# Patient Record
Sex: Female | Born: 1970 | Race: Asian | Hispanic: Yes | Marital: Married | Smoking: Never smoker
Health system: Southern US, Community
[De-identification: ages and names within clinical notes are randomized; demographics above are authoritative.]

## PROBLEM LIST (undated history)

## (undated) NOTE — *Deleted (*Deleted)
It was great to see you!  Our plans for today:  - *** -   We are checking some labs today, I will call you if they are abnormal will send you a MyChart message or a letter if they are normal.  If you do not hear about your labs in the next 2 weeks please let us know.***  Take care and seek immediate care sooner if you develop any concerns.   Dr. Daysean Tinkham [facility]  

---

## 2019-09-17 ENCOUNTER — Other Ambulatory Visit: Payer: Self-pay

## 2019-09-17 ENCOUNTER — Ambulatory Visit (INDEPENDENT_AMBULATORY_CARE_PROVIDER_SITE_OTHER): Payer: Self-pay | Admitting: Family Medicine

## 2019-09-17 ENCOUNTER — Encounter: Payer: Self-pay | Admitting: Family Medicine

## 2019-09-17 VITALS — BP 120/62 | HR 75 | Ht 61.06 in | Wt 139.2 lb

## 2019-09-17 DIAGNOSIS — H04129 Dry eye syndrome of unspecified lacrimal gland: Secondary | ICD-10-CM

## 2019-09-17 DIAGNOSIS — H04123 Dry eye syndrome of bilateral lacrimal glands: Secondary | ICD-10-CM

## 2019-09-17 DIAGNOSIS — Z0000 Encounter for general adult medical examination without abnormal findings: Secondary | ICD-10-CM

## 2019-09-17 MED ORDER — SYSTANE 0.4-0.3 % OP SOLN: 1.0000 [drp] | OPHTHALMIC | 0 refills | Status: AC | PRN

## 2019-09-17 NOTE — Patient Instructions (Addendum)
It was great to see you!  Our plans for today:  -  We're going to check a few labs - I am prescribing a moisturizing eye drop  We are checking some labs today, we will call you or send you a letter if they are abnormal.   Take care and seek immediate care sooner if you develop any concerns.   Dr. [facility]

## 2019-09-17 NOTE — Progress Notes (Signed)
   Subjective:    Patient ID: Veronica Leon, female    DOB: March 26, 1971, 48 y.o.   MRN: [id number]   CC: Establish care  HPI:  Veronica Leon is a very pleasant 48 year old female that presents today to establish care.  She states that she has no health conditions and takes no medications at this time.  Does have family history with her father passing away in his 33s from a myocardial infarction.  Patient states for the past 5-6 years she has had an issue with some dry eyes.  She states that this affects both eyes equally and she has not found any particular time of the year where this was worse.  She denies any eye pain or any change in vision during this time.  She states that she did recently see an eye doctor about 4 to 5 months ago to check her vision and this appointment was unremarkable.  She states that the dry seem to bother her most at the end of the day, particularly when she has an air conditioner or heater running.  She has tried some moisturizing eyedrops in the past and these worked well for her.  ROS: pertinent noted in the HPI   Pertinent PMH, PSH, FH, SoHx: Father passed away in his 23s from a MI.  Smoking status - Never  Objective:  BP 120/62   Pulse 75   Ht 5' 1.06" (1.551 m)   Wt 139 lb 4 oz (63.2 kg)   SpO2 99%   BMI 26.26 kg/m   Vitals and nursing note reviewed  General: NAD, pleasant, able to participate in exam HEENT: Eyes without erythema, injection Cardiac: RRR, S1 S2 present. normal heart sounds, no murmurs. Respiratory: CTAB, normal effort, No wheezes, rales or rhonchi Abdomen: Bowel sounds present, no abdominal tenderness, nondistended, no hepatosplenomegaly Skin: warm and dry, no rashes noted Neuro: alert, no obvious focal deficits Psych: Normal affect and mood   Assessment & Plan:    Dry eyes Plan: -We will prescribe moisturizing eyedrops -Patient to follow-up if she has worsening symptoms, any eye pain, change in vision at which point we will  refer to an eye specialist. -Patient in agreement with plan  Healthcare maintenance: -We will check lipid panel today -We will check a CMP today  Lurline Del, [facility] PGY-1

## 2019-09-17 NOTE — Assessment & Plan Note (Signed)
Plan: -We will prescribe moisturizing eyedrops -Patient to follow-up if she has worsening symptoms, any eye pain, change in vision at which point we will refer to an eye specialist. -Patient in agreement with plan

## 2019-09-18 ENCOUNTER — Telehealth: Payer: Self-pay | Admitting: Family Medicine

## 2019-09-18 LAB — LIPID PANEL
Chol/HDL Ratio: 3.2 ratio (ref 0.0–4.4)
Cholesterol, Total: 186 mg/dL (ref 100–199)
HDL: 59 mg/dL (ref >39)
LDL Chol Calc (NIH): 114 mg/dL — ABNORMAL HIGH (ref 0–99)
Triglycerides: 68 mg/dL (ref 0–149)
VLDL Cholesterol Cal: 13 mg/dL (ref 5–40)

## 2019-09-18 LAB — COMPREHENSIVE METABOLIC PANEL
ALT: 15 IU/L (ref 0–32)
AST: 21 IU/L (ref 0–40)
Albumin/Globulin Ratio: 1.5 (ref 1.2–2.2)
Albumin: 4.4 g/dL (ref 3.8–4.8)
Alkaline Phosphatase: 83 IU/L (ref 39–117)
BUN/Creatinine Ratio: 8 — ABNORMAL LOW (ref 9–23)
BUN: 7 mg/dL (ref 6–24)
Bilirubin Total: 0.3 mg/dL (ref 0.0–1.2)
CO2: 25 mmol/L (ref 20–29)
Calcium: 9.8 mg/dL (ref 8.7–10.2)
Chloride: 102 mmol/L (ref 96–106)
Creatinine, Ser: 0.91 mg/dL (ref 0.57–1.00)
GFR calc Af Amer: 86 mL/min/{1.73_m2} (ref >59)
GFR calc non Af Amer: 75 mL/min/{1.73_m2} (ref >59)
Globulin, Total: 3 g/dL (ref 1.5–4.5)
Glucose: 85 mg/dL (ref 65–99)
Potassium: 4.8 mmol/L (ref 3.5–5.2)
Sodium: 139 mmol/L (ref 134–144)
Total Protein: 7.4 g/dL (ref 6.0–8.5)

## 2019-09-18 NOTE — Telephone Encounter (Signed)
Discussed lab results with patient, including her elevated LDL. Patient requests to put extra emphasis on her diet and exercise for 3-6 months and revisit the prospects of initiating a statin medication after that. She understands that her family history puts her at greater risk for cardiac events and that statins can reduce her LDL and overall risks of these events. Plan to follow up with patient in 3-6 months.

## 2020-06-11 ENCOUNTER — Other Ambulatory Visit: Payer: Self-pay | Admitting: Critical Care Medicine

## 2020-06-11 ENCOUNTER — Other Ambulatory Visit: Payer: Self-pay

## 2020-06-11 DIAGNOSIS — Z20822 Contact with and (suspected) exposure to covid-19: Secondary | ICD-10-CM

## 2020-06-12 LAB — NOVEL CORONAVIRUS, NAA: SARS-CoV-2, NAA: NOT DETECTED

## 2020-06-12 LAB — SPECIMEN STATUS REPORT

## 2020-06-12 LAB — SARS-COV-2, NAA 2 DAY TAT

## 2020-07-22 ENCOUNTER — Other Ambulatory Visit: Payer: Self-pay

## 2020-07-22 DIAGNOSIS — Z20822 Contact with and (suspected) exposure to covid-19: Secondary | ICD-10-CM

## 2020-07-24 LAB — SARS-COV-2, NAA 2 DAY TAT

## 2020-07-24 LAB — NOVEL CORONAVIRUS, NAA: SARS-CoV-2, NAA: NOT DETECTED

## 2020-07-26 NOTE — Progress Notes (Deleted)
Subjective:     Veronica Leon is a 49 y.o. female and is here for a comprehensive physical exam. The patient reports {problems:16946}.  Social History   Socioeconomic History  . Marital status: Married    Spouse name: Not on file  . Number of children: Not on file  . Years of education: Not on file  . Highest education level: Not on file  Occupational History  . Not on file  Tobacco Use  . Smoking status: Never Smoker  Substance and Sexual Activity  . Alcohol use: Yes    Comment: Rare occasion  . Drug use: Never  . Sexual activity: Not on file  Other Topics Concern  . Not on file  Social History Narrative  . Not on file   Social Determinants of Health   Financial Resource Strain:   . Difficulty of Paying Living Expenses: Not on file  Food Insecurity:   . Worried About Programme researcher, broadcasting/film/video in the Last Year: Not on file  . Ran Out of Food in the Last Year: Not on file  Transportation Needs:   . Lack of Transportation (Medical): Not on file  . Lack of Transportation (Non-Medical): Not on file  Physical Activity:   . Days of Exercise per Week: Not on file  . Minutes of Exercise per Session: Not on file  Stress:   . Feeling of Stress : Not on file  Social Connections:   . Frequency of Communication with Friends and Family: Not on file  . Frequency of Social Gatherings with Friends and Family: Not on file  . Attends Religious Services: Not on file  . Active Member of Clubs or Organizations: Not on file  . Attends Banker Meetings: Not on file  . Marital Status: Not on file  Intimate Partner Violence:   . Fear of Current or Ex-Partner: Not on file  . Emotionally Abused: Not on file  . Physically Abused: Not on file  . Sexually Abused: Not on file   Health Maintenance  Topic Date Due  . Hepatitis C Screening  Never done  . PAP SMEAR-Modifier  03/16/2020  . INFLUENZA VACCINE  05/16/2020  . HIV Screening  09/16/2020 (Originally 05/26/1986)  . TETANUS/TDAP   10/16/2024  . COVID-19 Vaccine  Completed    The following portions of the patient's history were reviewed and updated as appropriate: allergies, current medications, past family history, past medical history, past social history, past surgical history and problem list.  Review of Systems Pertinent items are noted in HPI.   Objective:    {female exam, choose systems:17926}    Assessment:    Healthy female exam. ***     Plan:     See After Visit Summary for Counseling Recommendations

## 2020-07-27 ENCOUNTER — Ambulatory Visit: Payer: Self-pay | Admitting: Family Medicine

## 2020-09-21 NOTE — Patient Instructions (Signed)
It was great to see you! Thank you for allowing me to participate in your care!  Our plans for today:  -We checked a lipid panel today, I will call you with the results of this when it returns.  If your cholesterol remains elevated I strongly recommend starting statin therapy and we can discuss this further when your labs result. -We also performed Pap smear today -We are also checking a lipid panel and a CBG for screening for diabetes as well as to follow-up on your high cholesterol level -If you continue to have prolonged bleeding for an additional 4 days from today which will be a total of 14 days I would like for you to follow-up with me.  We are checking a CBC to look for signs of anemia -We are also checking a TSH due to concern for hair loss and your past abnormal TSH when you were in [place].   We are checking some labs today, I will call you if they are abnormal will send you a MyChart message or a letter if they are normal.  If you do not hear about your labs in the next 2 weeks please let us know.  Take care and seek immediate care sooner if you develop any concerns.   Dr. Jackelyn Poling, DO [facility]

## 2020-09-21 NOTE — Progress Notes (Signed)
SUBJECTIVE:   CHIEF COMPLAINT / HPI:   Annual physical: Patient is a pleasant 49 year old female that presents for annual physical today.  She states that she walks for exercise 3-4 times per week.  Denies use of tobacco products.  States that she does occasionally consume alcohol.  Patient denies any use of illicit drugs.  She does state that she feels safe in her relationship.  Patient states she is a vegetarian.  Family history of cardiac disease: Patient has a significant family history for Father passed away in his 30s from myocardial infarction. Previously patient's cholesterol panel showed an LDL elevated at 114.  It was discussed with the patient about starting statin therapy particularly due to her family history, however patient wished to defer that and focus on diet and exercise at that time.    Concern for hypothyroidism: Patient endorses some hair loss over past few months and when she was in [place] she had a thyroid check which was borderline abnormal.  Patient states that her energy level has been appropriate but she would like to have her TSH checked as she had the borderline abnormal 1 when it was checked in [place].  Abnormal uterine bleeding: Patient states that she normally has menstrual cycles which last 4 to 5 days of moderate bleeding followed by 2 days of light bleeding.  However her last menstrual cycle started a little bit late and has been going on for approximately 10 days.  She states this is been light to moderate and she does not have any overall concerns as this is only been one menstrual cycle.  She denies any decreased energy and does not believe to have any other symptoms of anemia.  She endorses that she feels this is most likely as she is depression menopause.  PERTINENT  PMH / PSH: Family history of cardiac disease  OBJECTIVE:   BP 108/64   Pulse 70   Ht 5\' 1"  (1.549 m)   Wt 144 lb (65.3 kg)   LMP 09/13/2020 (Exact Date)   BMI 27.21 kg/m     General: NAD, pleasant, able to participate in exam HEENT: No pharyngeal erythema Cardiac: RRR, no murmurs. Respiratory: CTAB, normal effort, No wheezes, rales or rhonchi Abdomen: Bowel sounds present, nontender Extremities: no edema or cyanosis. Pelvic exam: VULVA: normal appearing vulva with no masses, tenderness or lesions, VAGINA: normal appearing vagina with normal color and discharge, no lesions, CERVIX: cervical discharge present - bloody, exam chaperoned by Jazmin. Neuro: alert, no obvious focal deficits Psych: Normal affect and mood  ASSESSMENT/PLAN:   Hair loss Assessment: 49 year old female with recent hair loss over the past few months and a previous TSH in 54 which she states was borderline abnormal.  Patient endorses having good energy levels but would like her TSH rechecked today for verification. Plan: -We will to recheck TSH -Follow-up as needed  Abnormal uterine bleeding (AUB) Assessment: 49 year old female with prolonged uterine bleeding as she is approaching menopause.  She states that her periods usually last for around 4 days with 2 following days which are lighter, however this last cycle has been lasting for approximately 10 days but has been light to moderate throughout that time.  Patient does not have any significant current concerns for anemia but I would like to check a CBC for this.  Patient overall has little concerned about this as she is only having light bleeding at this time Plan: -We will check CBC as above -Patient plans to let me know  if her menstrual cycle does not end in the next 3 to 4 days  Family history of heart disease Assessment: 49 year old female with a previous LDL level of 114, and a significant family history with her father passed away in his 30s from myocardial infarction.  Previously discussed with the patient about starting statin therapy however she wished to focus on diet and exercise at that time.  We will recheck her lipid  panel today, I have discussed with the patient my recommendation to start statin therapy if she is above goal on her LDL. Plan: -Lipid panel as above -If LDL remains elevated will discuss with patient about the recommendation of starting statin therapy, she endorses today that she really prefers to avoid medication if possible but she understands that I may discuss starting statin therapy if it is indicated.  She understands that she ultimately has a final decision in this.  Health maintenance: -Pap smear performed today -Lipid panel as above -Patient request a CBG to screen for diabetes, she states she is not interested in A1c at this time.  Jackelyn Poling, DO [facility]    This note was prepared using Dragon voice recognition software and may include unintentional dictation errors due to the inherent limitations of voice recognition software.

## 2020-09-22 ENCOUNTER — Ambulatory Visit (INDEPENDENT_AMBULATORY_CARE_PROVIDER_SITE_OTHER): Payer: 59 | Admitting: Family Medicine

## 2020-09-22 ENCOUNTER — Encounter: Payer: Self-pay | Admitting: Family Medicine

## 2020-09-22 ENCOUNTER — Other Ambulatory Visit: Payer: Self-pay

## 2020-09-22 ENCOUNTER — Other Ambulatory Visit (HOSPITAL_COMMUNITY)
Admission: RE | Admit: 2020-09-22 | Discharge: 2020-09-22 | Disposition: A | Discharge status: Home / Self Care | Payer: 59 | Source: Ambulatory Visit | Attending: Family Medicine | Admitting: Family Medicine

## 2020-09-22 VITALS — BP 108/64 | HR 70 | Ht 61.0 in | Wt 144.0 lb

## 2020-09-22 DIAGNOSIS — Z124 Encounter for screening for malignant neoplasm of cervix: Secondary | ICD-10-CM

## 2020-09-22 DIAGNOSIS — N939 Abnormal uterine and vaginal bleeding, unspecified: Secondary | ICD-10-CM | POA: Diagnosis not present

## 2020-09-22 DIAGNOSIS — Z131 Encounter for screening for diabetes mellitus: Secondary | ICD-10-CM

## 2020-09-22 DIAGNOSIS — Z8249 Family history of ischemic heart disease and other diseases of the circulatory system: Secondary | ICD-10-CM

## 2020-09-22 DIAGNOSIS — Z13220 Encounter for screening for lipoid disorders: Secondary | ICD-10-CM | POA: Diagnosis not present

## 2020-09-22 DIAGNOSIS — L659 Nonscarring hair loss, unspecified: Secondary | ICD-10-CM | POA: Diagnosis not present

## 2020-09-22 LAB — GLUCOSE, POCT (MANUAL RESULT ENTRY): POC Glucose: 86 mg/dl (ref 70–99)

## 2020-09-22 NOTE — Assessment & Plan Note (Signed)
Assessment: 49 year old female with a previous LDL level of 114, and a significant family history with her father passed away in his 30s from myocardial infarction.  Previously discussed with the patient about starting statin therapy however she wished to focus on diet and exercise at that time.  We will recheck her lipid panel today, I have discussed with the patient my recommendation to start statin therapy if she is above goal on her LDL. Plan: -Lipid panel as above -If LDL remains elevated will discuss with patient about the recommendation of starting statin therapy, she endorses today that she really prefers to avoid medication if possible but she understands that I may discuss starting statin therapy if it is indicated.  She understands that she ultimately has a final decision in this.

## 2020-09-22 NOTE — Assessment & Plan Note (Signed)
Assessment: 49 year old female with recent hair loss over the past few months and a previous TSH in [place] which she states was borderline abnormal.  Patient endorses having good energy levels but would like her TSH rechecked today for verification. Plan: -We will to recheck TSH -Follow-up as needed

## 2020-09-22 NOTE — Assessment & Plan Note (Addendum)
Assessment: 49 year old female with prolonged uterine bleeding as she is approaching menopause.  She states that her periods usually last for around 4 days with 2 following days which are lighter, however this last cycle has been lasting for approximately 10 days but has been light to moderate throughout that time.  Patient does not have any significant current concerns for anemia but I would like to check a CBC for this.  Patient overall has little concerned about this as she is only having light bleeding at this time Plan: -We will check CBC as above -Patient plans to let me know if her menstrual cycle does not end in the next 3 to 4 days

## 2020-09-23 ENCOUNTER — Other Ambulatory Visit: Payer: Self-pay | Admitting: Family Medicine

## 2020-09-23 ENCOUNTER — Telehealth: Payer: Self-pay | Admitting: Family Medicine

## 2020-09-23 DIAGNOSIS — R7989 Other specified abnormal findings of blood chemistry: Secondary | ICD-10-CM

## 2020-09-23 DIAGNOSIS — L659 Nonscarring hair loss, unspecified: Secondary | ICD-10-CM

## 2020-09-23 LAB — CBC WITH DIFFERENTIAL/PLATELET
Basophils Absolute: 0 10*3/uL (ref 0.0–0.2)
Basos: 1 %
EOS (ABSOLUTE): 0.2 10*3/uL (ref 0.0–0.4)
Eos: 3 %
Hematocrit: 43.6 % (ref 34.0–46.6)
Hemoglobin: 14.5 g/dL (ref 11.1–15.9)
Immature Grans (Abs): 0 10*3/uL (ref 0.0–0.1)
Immature Granulocytes: 0 %
Lymphocytes Absolute: 2.1 10*3/uL (ref 0.7–3.1)
Lymphs: 40 %
MCH: 29 pg (ref 26.6–33.0)
MCHC: 33.3 g/dL (ref 31.5–35.7)
MCV: 87 fL (ref 79–97)
Monocytes Absolute: 0.4 10*3/uL (ref 0.1–0.9)
Monocytes: 9 %
Neutrophils Absolute: 2.4 10*3/uL (ref 1.4–7.0)
Neutrophils: 47 %
Platelets: 319 10*3/uL (ref 150–450)
RBC: 5 x10E6/uL (ref 3.77–5.28)
RDW: 12.2 % (ref 11.7–15.4)
WBC: 5.1 10*3/uL (ref 3.4–10.8)

## 2020-09-23 LAB — LIPID PANEL
Chol/HDL Ratio: 3 ratio (ref 0.0–4.4)
Cholesterol, Total: 200 mg/dL — ABNORMAL HIGH (ref 100–199)
HDL: 67 mg/dL (ref >39)
LDL Chol Calc (NIH): 119 mg/dL — ABNORMAL HIGH (ref 0–99)
Triglycerides: 77 mg/dL (ref 0–149)
VLDL Cholesterol Cal: 14 mg/dL (ref 5–40)

## 2020-09-23 LAB — TSH: TSH: 7.74 u[IU]/mL — ABNORMAL HIGH (ref 0.450–4.500)

## 2020-09-23 NOTE — Telephone Encounter (Signed)
Called patient to discuss her lab results.  Patient had an LDL which was above goal as well as family history with her father passed away in his 30s from myocardial infarction.  I discussed with patient on the phone as well as several times prior to this that I do strongly recommend a statin medication due to her family history.  Patient endorses that she is about to start a more vigorous exercise routine and has already adjusted her diet and would request rechecking an LDL in 6 months.  With regard to her TSH the patient does not endorse any symptoms of hypothyroidism other than some possible hair loss.  She does not endorse any loss of energy or unusual weight gain.  I discussed with patient that there are 3 options for this elevated TSH.  Option 1, we could recheck it in about [redacted] weeks along with a free T4 and make adjustments depending on this.  Option 2, we could start a low-dose of Synthroid 25 mcg/day because that she believes she had an elevated TSH in [place] in the recent past.  An option 3 would be to do nothing and continue to monitor for any symptoms as her TSH likely represents subclinical hyperthyroidism at this time.  Patient dorsals that she would like to recheck this in about 6 weeks as well as free T4.  I explained patient that I would place an order for a future lab draw for TSH and free T4 for about 6 weeks now she plans to make an appointment at her convenience to come in for the lab draw.  I also explained to the patient that her CBC was within normal limits.

## 2020-09-24 LAB — CYTOLOGY - PAP
Comment: NEGATIVE
Diagnosis: NEGATIVE
High risk HPV: NEGATIVE

## 2020-11-03 ENCOUNTER — Other Ambulatory Visit: Payer: 59

## 2020-11-03 ENCOUNTER — Other Ambulatory Visit: Payer: Self-pay

## 2020-11-03 DIAGNOSIS — R7989 Other specified abnormal findings of blood chemistry: Secondary | ICD-10-CM

## 2020-11-03 DIAGNOSIS — L659 Nonscarring hair loss, unspecified: Secondary | ICD-10-CM

## 2020-11-04 ENCOUNTER — Encounter: Payer: Self-pay | Admitting: Family Medicine

## 2020-11-04 LAB — TSH+FREE T4
Free T4: 0.97 ng/dL (ref 0.82–1.77)
TSH: 8.5 u[IU]/mL — ABNORMAL HIGH (ref 0.450–4.500)

## 2021-03-03 ENCOUNTER — Encounter: Payer: Self-pay | Admitting: Family Medicine

## 2021-03-03 DIAGNOSIS — E785 Hyperlipidemia, unspecified: Secondary | ICD-10-CM

## 2021-03-03 DIAGNOSIS — R7989 Other specified abnormal findings of blood chemistry: Secondary | ICD-10-CM

## 2021-03-29 ENCOUNTER — Other Ambulatory Visit: Payer: 59

## 2021-03-29 ENCOUNTER — Other Ambulatory Visit: Payer: Self-pay

## 2021-03-29 DIAGNOSIS — R7989 Other specified abnormal findings of blood chemistry: Secondary | ICD-10-CM

## 2021-03-29 DIAGNOSIS — E785 Hyperlipidemia, unspecified: Secondary | ICD-10-CM

## 2021-03-30 LAB — TSH: TSH: 4.67 u[IU]/mL — ABNORMAL HIGH (ref 0.450–4.500)

## 2021-03-30 LAB — LDL CHOLESTEROL, DIRECT: LDL Direct: 90 mg/dL (ref 0–99)

## 2021-04-04 NOTE — Progress Notes (Signed)
    SUBJECTIVE:   CHIEF COMPLAINT / HPI:   Hyperlipidemia: Patient is 50 year old female who presents today to follow-up on hyperlipidemia.  Patient is previously been hesitant to start a statin even though her father did pass away from an MI in his 8s. Last lipid panel showed an LDL of 119, HDL of 67, this was in December 2021.  Our plan had been for her to work on diet and exercise and follow-up approximately 6 months later for recheck of direct LDL and additional consideration of starting a statin.  She states she is walking most days of the week for 40 minutes of brisk walking. She is on the wait list for swimming lessons and plans to start swimming once she learns.  Subclinical hypothyroidism: Patient with several TSH checks over the past year.  7.74, 8.50, 4.67.  She also had checked a free T4 during the 8.50 check which was within normal limits.  We have had previous discussions about subclinical hypothyroidism and the pros and cons of initiating treatment.  She states she has not been experiencing fatigue at this time. .  Chipping of toe nails. Nothing in finger nails, great toe on both feet have some flaking and chipping of nails over last month and a half. No burning in feet, no systemic symptoms.  PERTINENT  PMH / PSH: History of hyperlipidemia  OBJECTIVE:   BP 121/64   Pulse 62   Ht 5\' 1"  (1.549 m)   Wt 143 lb 9.6 oz (65.1 kg)   LMP 03/25/2021   SpO2 99%   BMI 27.13 kg/m    General: NAD, pleasant, able to participate in exam Cardiac: RRR, no murmurs. Respiratory: CTAB, normal effort, No wheezes, rales or rhonchi Skin: warm and dry, great toe nail with some flaking and chipping bilaterally. Neuro: alert, no obvious focal deficits Psych: Normal affect and mood  ASSESSMENT/PLAN:   Subclinical hypothyroidism Will check TSH in 6 months.  Family history of heart disease Last LDL of improved from 119 to 90.  Patient continues to do 40 minutes of brisk walking most days  of the week.  We discussed the pros and cons of statin usage.  I do still think it would be appropriate with her family history of her father passed away in his 30s from an MI.  That being said her numbers have substantially improved with her diet and exercise and through shared decision making we decided to recheck it in about 6 months to see how it continues to improve.   Health maintenance Recommended screening colonoscopy for colon cancer screening.She wants to hold off.  Discussed Cologuard and we can continue having discussion with this at next appointment.  Toenail flaking: Likely fungal.  No pitting.  Some flaking and chipping of thick toenails most pronounced on the great toe bilaterally.  No chipping or pitting of the fingernails.  Likely fungal in etiology.  Discussed treatment modalities including long-term antifungals.  This did pop up over the past month or 2 and the patient has increased her exercise.  Patient wishes to hold off at this time and we can consider treating in the future.  She understands that treatment can be prolonged.  05/25/2021, DO [facility]

## 2021-04-05 ENCOUNTER — Encounter: Payer: Self-pay | Admitting: Family Medicine

## 2021-04-05 ENCOUNTER — Ambulatory Visit (INDEPENDENT_AMBULATORY_CARE_PROVIDER_SITE_OTHER): Payer: 59 | Admitting: Family Medicine

## 2021-04-05 ENCOUNTER — Other Ambulatory Visit: Payer: Self-pay

## 2021-04-05 VITALS — BP 121/64 | HR 62 | Ht 61.0 in | Wt 143.6 lb

## 2021-04-05 DIAGNOSIS — E038 Other specified hypothyroidism: Secondary | ICD-10-CM | POA: Diagnosis not present

## 2021-04-05 DIAGNOSIS — Z8249 Family history of ischemic heart disease and other diseases of the circulatory system: Secondary | ICD-10-CM

## 2021-04-05 DIAGNOSIS — E785 Hyperlipidemia, unspecified: Secondary | ICD-10-CM

## 2021-04-05 NOTE — Assessment & Plan Note (Signed)
Last LDL of improved from 119 to 90.  Patient continues to do 40 minutes of brisk walking most days of the week.  We discussed the pros and cons of statin usage.  I do still think it would be appropriate with her family history of her father passed away in his 30s from an MI.  That being said her numbers have substantially improved with her diet and exercise and through shared decision making we decided to recheck it in about 6 months to see how it continues to improve.

## 2021-04-05 NOTE — Patient Instructions (Signed)
Congratulations on the great work with your LDL!  I would like to see back in 6 to 12 months and we can recheck your cholesterol and your thyroid level  If your toenails become a big problem let me know and we can always start an antifungal medication.  I think it is fine to monitor them for the time being  I do recommend considering a colonoscopy or the Cologuard to screen for colon cancer.  We can continue to have discussions about this.

## 2021-04-05 NOTE — Assessment & Plan Note (Signed)
Will check TSH in 6 months.

## 2021-08-15 ENCOUNTER — Encounter: Payer: Self-pay | Admitting: Family Medicine

## 2021-08-16 ENCOUNTER — Other Ambulatory Visit: Payer: Self-pay | Admitting: Family Medicine

## 2021-08-16 DIAGNOSIS — Z8249 Family history of ischemic heart disease and other diseases of the circulatory system: Secondary | ICD-10-CM

## 2021-08-16 DIAGNOSIS — E038 Other specified hypothyroidism: Secondary | ICD-10-CM

## 2021-09-07 ENCOUNTER — Other Ambulatory Visit: Payer: 59

## 2021-09-07 ENCOUNTER — Other Ambulatory Visit: Payer: Self-pay

## 2021-09-07 DIAGNOSIS — Z8249 Family history of ischemic heart disease and other diseases of the circulatory system: Secondary | ICD-10-CM

## 2021-09-07 DIAGNOSIS — E038 Other specified hypothyroidism: Secondary | ICD-10-CM

## 2021-09-08 LAB — TSH: TSH: 6.23 u[IU]/mL — ABNORMAL HIGH (ref 0.450–4.500)

## 2021-09-08 LAB — LDL CHOLESTEROL, DIRECT: LDL Direct: 114 mg/dL — ABNORMAL HIGH (ref 0–99)

## 2021-09-14 ENCOUNTER — Ambulatory Visit: Payer: 59 | Admitting: Family Medicine

## 2021-09-14 ENCOUNTER — Other Ambulatory Visit: Payer: Self-pay

## 2021-09-14 ENCOUNTER — Ambulatory Visit (INDEPENDENT_AMBULATORY_CARE_PROVIDER_SITE_OTHER): Payer: 59 | Admitting: Family Medicine

## 2021-09-14 ENCOUNTER — Encounter: Payer: Self-pay | Admitting: Family Medicine

## 2021-09-14 ENCOUNTER — Ambulatory Visit (INDEPENDENT_AMBULATORY_CARE_PROVIDER_SITE_OTHER): Payer: 59

## 2021-09-14 DIAGNOSIS — E785 Hyperlipidemia, unspecified: Secondary | ICD-10-CM

## 2021-09-14 DIAGNOSIS — E038 Other specified hypothyroidism: Secondary | ICD-10-CM | POA: Diagnosis not present

## 2021-09-14 DIAGNOSIS — Z23 Encounter for immunization: Secondary | ICD-10-CM | POA: Diagnosis not present

## 2021-09-14 NOTE — Patient Instructions (Signed)
Today we discussed your previous lab checks including your TSH and LDL.  We do not need to do anything for the TSH at this time unless you have significant symptoms.  For the LDL I do recommend starting a medication called atorvastatin.  As we discussed once you get back from your trip you can let me know and I will send it into the pharmacy.  I recommend that we recheck an LDL about 3 to 4 months after starting that medication.

## 2021-09-14 NOTE — Assessment & Plan Note (Signed)
LDL elevated at 114.  She has a severe family history with a father having an MI at age 50 and an uncle on the father side with bypass surgery by age 76 for heart attack.  She has previously been hesitant about starting statin medication due to wanting to avoid medications.  Diet and exercise have unfortunately not gotten the LDL down to our goal.  I would shoot for a goal of less than 70 due to her family history.  She is interested in starting atorvastatin when she returns from her trip to [place], later in December.  She is going to let me know when she gets back and wants me to send in the prescription at that time.  We will start atorvastatin 40 mg at that time.  Follow-up in 3 months after that for LDL direct.

## 2021-09-14 NOTE — Assessment & Plan Note (Signed)
TSH consistent with subclinical hypothyroidism.  Patient has not experiencing any significant symptoms such as hair loss, severe fatigue or change in fatigue, or constipation.  Follow-up as needed.  Discussed that there is no need for medication at this time

## 2021-09-14 NOTE — Progress Notes (Signed)
    SUBJECTIVE:   CHIEF COMPLAINT / HPI:   Lab Follow up: Has had reduced motivation with exercise lately. She is leaving for about 3 weeks to travel to [place]. She denies significant change in fatigue, hairloss, constipation.  LDL: 114 on last check. Father with MI at age 50. Uncle with bypass surgery at age 35.  We previously discussed starting a statin but she has been hesitant due to not liking to be on medications unless absolutely necessary.  She denies any symptoms at this time.  PERTINENT  PMH / PSH: History of subclinical hypothyroidism  OBJECTIVE:   BP 109/64   Pulse 65   Ht 5\' 1"  (1.549 m)   Wt 145 lb 8 oz (66 kg)   LMP 08/23/2021   SpO2 99%   BMI 27.49 kg/m    General: NAD, pleasant, able to participate in exam Cardiac: RRR, no murmurs. Respiratory: CTAB, normal effort Psych: Normal affect and mood  ASSESSMENT/PLAN:   Subclinical hypothyroidism TSH consistent with subclinical hypothyroidism.  Patient has not experiencing any significant symptoms such as hair loss, severe fatigue or change in fatigue, or constipation.  Follow-up as needed.  Discussed that there is no need for medication at this time  Hyperlipidemia LDL elevated at 114.  She has a severe family history with a father having an MI at age 27 and an uncle on the father side with bypass surgery by age 39 for heart attack.  She has previously been hesitant about starting statin medication due to wanting to avoid medications.  Diet and exercise have unfortunately not gotten the LDL down to our goal.  I would shoot for a goal of less than 70 due to her family history.  She is interested in starting atorvastatin when she returns from her trip to [place], later in December.  She is going to let me know when she gets back and wants me to send in the prescription at that time.  We will start atorvastatin 40 mg at that time.  Follow-up in 3 months after that for LDL direct.     January, DO [facility]

## 2022-02-13 ENCOUNTER — Encounter: Payer: Self-pay | Admitting: Family Medicine

## 2022-02-13 ENCOUNTER — Ambulatory Visit (INDEPENDENT_AMBULATORY_CARE_PROVIDER_SITE_OTHER): Payer: Managed Care, Other (non HMO) | Admitting: Family Medicine

## 2022-02-13 VITALS — BP 121/70 | HR 55 | Wt 145.0 lb

## 2022-02-13 DIAGNOSIS — R5383 Other fatigue: Secondary | ICD-10-CM | POA: Diagnosis not present

## 2022-02-13 DIAGNOSIS — E785 Hyperlipidemia, unspecified: Secondary | ICD-10-CM

## 2022-02-13 DIAGNOSIS — Z114 Encounter for screening for human immunodeficiency virus [HIV]: Secondary | ICD-10-CM

## 2022-02-13 DIAGNOSIS — E038 Other specified hypothyroidism: Secondary | ICD-10-CM

## 2022-02-13 DIAGNOSIS — Z1231 Encounter for screening mammogram for malignant neoplasm of breast: Secondary | ICD-10-CM

## 2022-02-13 DIAGNOSIS — Z1159 Encounter for screening for other viral diseases: Secondary | ICD-10-CM

## 2022-02-13 NOTE — Patient Instructions (Signed)
Today we are checking some labs including your LDL, thyroid labs, and a CBC for fatigue.  I will let you know the results when they return. ? ?I have sent a referral for endocrinology due to the elevated thyroid peroxidase level.  They should contact you in the next 1 to 2 weeks. ? ?For generalized health maintenance it also recommends colonoscopy, mammogram, HIV, and hepatitis C.  I have ordered the mammogram, HIV level, and hepatitis C level.  Once we have these completed I will let you know the results.  I do recommend considering the colonoscopy which I can always place in the future.  You do not need an appointment for this, just simply call and let me know. ?

## 2022-02-13 NOTE — Progress Notes (Signed)
? ? ?SUBJECTIVE:  ? ?CHIEF COMPLAINT / HPI:  ? ?Hyperlipidemia: ?Previous LDL 114.  Patient with significant family history of father having an MI at age 51 and her uncle and father side having bypass surgery by age 68 for heart attack.  At previous appointment she has been hesitant to start a statin medication but her last appointment she decided that she would like to initiate this when she returns from her trip to [place].  Today she presents to initiate atorvastatin.  She states she had her LDL checked in [place] in December and presents with lab work showing LDL of 95. ? ?Subclinical Hypothyroidism: ?Last TSH in [place] was in December and was 8 with normal T3 and T4. She also had thyroid peroxidase level checked and was significantly elevated at 60.4 (normal 0.08-8.0). She states over the last month she has noticed more fatigue, particularly in the morning. She normally is a "morning person" and wakes up early to exercise. ? ?Tachycardia: ?In [place] she felt her heart racing and fluttering and checked her BP at home which gives heart rate and noticed it was in the 140s, she took a photo of the bp cuff. This lasted for few minutes and then resolved. She denies any bouts of this since then. She denies chest pain when it occurred and denies presyncope or light headedness. ? ? ?PERTINENT  PMH / PSH: Subclinical hypothyroidism ? ?OBJECTIVE:  ? ?BP 121/70   Pulse (!) 55   Wt 145 lb (65.8 kg)   LMP 01/25/2022 (Exact Date)   SpO2 100%   BMI 27.40 kg/m?   ? ?General: NAD, pleasant, able to participate in exam ?HEENT: No thyromegaly or thyroid nodules ?Cardiac: Mildly bradycardic, no murmurs ?Respiratory: CTAB, normal effort, No wheezes, rales or rhonchi ?Skin: warm and dry, no rashes noted ?Neuro: alert, no obvious focal deficits ?Psych: Normal affect and mood ? ?ASSESSMENT/PLAN:  ? ? ?Hyperlipidemia: ?Significant family history with father having an MI at age 59 and her uncle having bypass surgery for heart attack  at age 21.  Previous LDL 114, however she had this checked in December in [place] with an LDL of 95.  She request rechecking it today.  Did discuss that it would be reasonable to consider statin regardless of the level due to her family history but also think it is reasonable to recheck and see where it is at and if it continues to improve then a statin would be less needed.   ? ?Tachycardia: ?Happened once about 5 months ago with heart rate in the 140s for several minutes.  She did not have any presyncope or chest pain when this occurred.  She continues to exercise and has not had this happen since then.  I did discuss that there was a possibility that she could have an arrhythmia that she does not feel but if she is not having symptoms and is only happened once 5 months ago I do not think we have to do further intervention at this time.  Discussed with her that if it happens again she should let us know but I think there is low benefit to a cardiology referral or Holter monitor given 1 bout of symptoms. ? ?Health maintenance: ?We will screen for breast cancer with mammogram, HIV, and hepatitis C as is recommended.  Discussed colonoscopy and she wishes to hold off on that at this time as she is vegetarian and has no family history of colon cancer.  I did recommend that despite this  it was still a recommendation and she is going to continue to consider it. ? ?Subclinical hypothyroidism  elevated thyroid peroxidase level: ?I have been following her with subclinical hypothyroidism for some time and recently when she was in [place] she had her levels rechecked as well as her thyroid peroxidase level.  This was significantly elevated at 60.  Discussed today that it would be reasonable to do a referral for endocrinology.  We also been to check her TSH and T4 for levels today.  She does endorse some increased fatigue which may be at play with this. ? ?Fatigue: ?See above in the subclinical hypothyroidism section.  We will  check a TSH and T4.  We will also check a CBC today.  She will follow-up once these labs are completed to decide extra test that may be needed. ? ?Jackelyn Poling, DO ?[facility]  ? ? ? ?

## 2022-02-14 ENCOUNTER — Other Ambulatory Visit: Payer: Self-pay | Admitting: Family Medicine

## 2022-02-14 DIAGNOSIS — D696 Thrombocytopenia, unspecified: Secondary | ICD-10-CM

## 2022-02-14 LAB — CBC
Hematocrit: 42.9 % (ref 34.0–46.6)
Hemoglobin: 14 g/dL (ref 11.1–15.9)
MCH: 28.3 pg (ref 26.6–33.0)
MCHC: 32.6 g/dL (ref 31.5–35.7)
MCV: 87 fL (ref 79–97)
Platelets: 112 10*3/uL — ABNORMAL LOW (ref 150–450)
RBC: 4.94 x10E6/uL (ref 3.77–5.28)
RDW: 12.9 % (ref 11.7–15.4)
WBC: 5.5 10*3/uL (ref 3.4–10.8)

## 2022-02-14 LAB — TSH+FREE T4
Free T4: 0.95 ng/dL (ref 0.82–1.77)
TSH: 5.74 u[IU]/mL — ABNORMAL HIGH (ref 0.450–4.500)

## 2022-02-14 LAB — HCV INTERPRETATION

## 2022-02-14 LAB — LDL CHOLESTEROL, DIRECT: LDL Direct: 104 mg/dL — ABNORMAL HIGH (ref 0–99)

## 2022-02-14 LAB — HCV AB W REFLEX TO QUANT PCR: HCV Ab: NONREACTIVE

## 2022-02-14 LAB — HIV ANTIBODY (ROUTINE TESTING W REFLEX): HIV Screen 4th Generation wRfx: NONREACTIVE

## 2022-02-14 NOTE — Progress Notes (Signed)
Called patient discussed her results.  TSH is mildly elevated similar to past levels.  We will continue with the referral to endocrinology given her elevated thyroid peroxidase antibody on her lab work performed in [place]. ? ?LDL cholesterol of 104.  Continue to recommend consideration for statin medication given her family history of early myocardial infarctions particular on her father's side.  We will continue to have discussions about this. ? ?HIV screen is negative as is the hepatitis C screen. ? ?CBC was mostly within normal limits with exception of a platelet count of 112.  Previous platelet count from 1 year ago was 319.  Discussed with her on the phone and she has not noticed any increase in bleeding or blood in stool or black stools.  She does not endorse occasional nosebleeds which quickly resolved.  Endorses no coughing up blood or ongoing nosebleeds.  I discussed that these are likely unrelated to the platelet count and are probably more due to seasonal allergies.  I did recommend rechecking the CBC in 1 month or sooner if she develops any significant bruising, bleeding, abdominal pain, or nosebleeds that do not quickly resolve or become more consistent.  We will order this future lab.

## 2022-03-01 ENCOUNTER — Ambulatory Visit
Admission: RE | Admit: 2022-03-01 | Discharge: 2022-03-01 | Disposition: A | Discharge status: Home / Self Care | Payer: Commercial Managed Care - HMO | Source: Ambulatory Visit | Attending: Family Medicine | Admitting: Family Medicine

## 2022-03-01 DIAGNOSIS — Z1231 Encounter for screening mammogram for malignant neoplasm of breast: Secondary | ICD-10-CM

## 2022-03-02 ENCOUNTER — Other Ambulatory Visit: Payer: Self-pay | Admitting: Family Medicine

## 2022-03-02 DIAGNOSIS — R928 Other abnormal and inconclusive findings on diagnostic imaging of breast: Secondary | ICD-10-CM

## 2022-03-16 ENCOUNTER — Ambulatory Visit
Admission: RE | Admit: 2022-03-16 | Discharge: 2022-03-16 | Disposition: A | Discharge status: Home / Self Care | Payer: Commercial Managed Care - HMO | Source: Ambulatory Visit | Attending: Family Medicine | Admitting: Family Medicine

## 2022-03-16 ENCOUNTER — Other Ambulatory Visit: Payer: Commercial Managed Care - HMO

## 2022-03-16 ENCOUNTER — Other Ambulatory Visit: Payer: Self-pay | Admitting: Family Medicine

## 2022-03-16 DIAGNOSIS — R921 Mammographic calcification found on diagnostic imaging of breast: Secondary | ICD-10-CM

## 2022-03-16 DIAGNOSIS — D696 Thrombocytopenia, unspecified: Secondary | ICD-10-CM

## 2022-03-16 DIAGNOSIS — R928 Other abnormal and inconclusive findings on diagnostic imaging of breast: Secondary | ICD-10-CM

## 2022-03-17 LAB — CBC
Hematocrit: 42.5 % (ref 34.0–46.6)
Hemoglobin: 14.2 g/dL (ref 11.1–15.9)
MCH: 29.3 pg (ref 26.6–33.0)
MCHC: 33.4 g/dL (ref 31.5–35.7)
MCV: 88 fL (ref 79–97)
Platelets: 287 10*3/uL (ref 150–450)
RBC: 4.84 x10E6/uL (ref 3.77–5.28)
RDW: 12.6 % (ref 11.7–15.4)
WBC: 6.4 10*3/uL (ref 3.4–10.8)

## 2022-03-21 ENCOUNTER — Encounter: Payer: Self-pay | Admitting: *Deleted

## 2022-03-23 ENCOUNTER — Ambulatory Visit
Admission: RE | Admit: 2022-03-23 | Discharge: 2022-03-23 | Disposition: A | Discharge status: Home / Self Care | Payer: Commercial Managed Care - HMO | Source: Ambulatory Visit | Attending: Family Medicine | Admitting: Family Medicine

## 2022-03-23 DIAGNOSIS — R921 Mammographic calcification found on diagnostic imaging of breast: Secondary | ICD-10-CM

## 2022-07-12 ENCOUNTER — Telehealth: Payer: Self-pay

## 2022-07-12 NOTE — Telephone Encounter (Signed)
Patient calls nurse line requesting an apt for her thyroid.   Patient reports she is unsure when she is supposed to return to [facility] for a FU.  Thyroid was last checked in May, however precious PCP sent a referral to endocrinology. Patient reports she did not know this.   [facility] office information given to patient to contact. Patient to call back if she has any issues scheduling an apt with them.   Patient appreciative.

## 2023-05-01 IMAGING — MG MM DIGITAL DIAGNOSTIC UNILAT*R*
3 series · 3 of 3 positions shown · non-contrast
Comparison: Previous exam(s).

CLINICAL DATA: Screening recall for right breast calcifications.

EXAM:
DIGITAL DIAGNOSTIC UNILATERAL RIGHT MAMMOGRAM
TECHNIQUE: Right digital diagnostic mammography was performed. Mammographic
images were processed with CAD.

[R ML (1 of 2)]
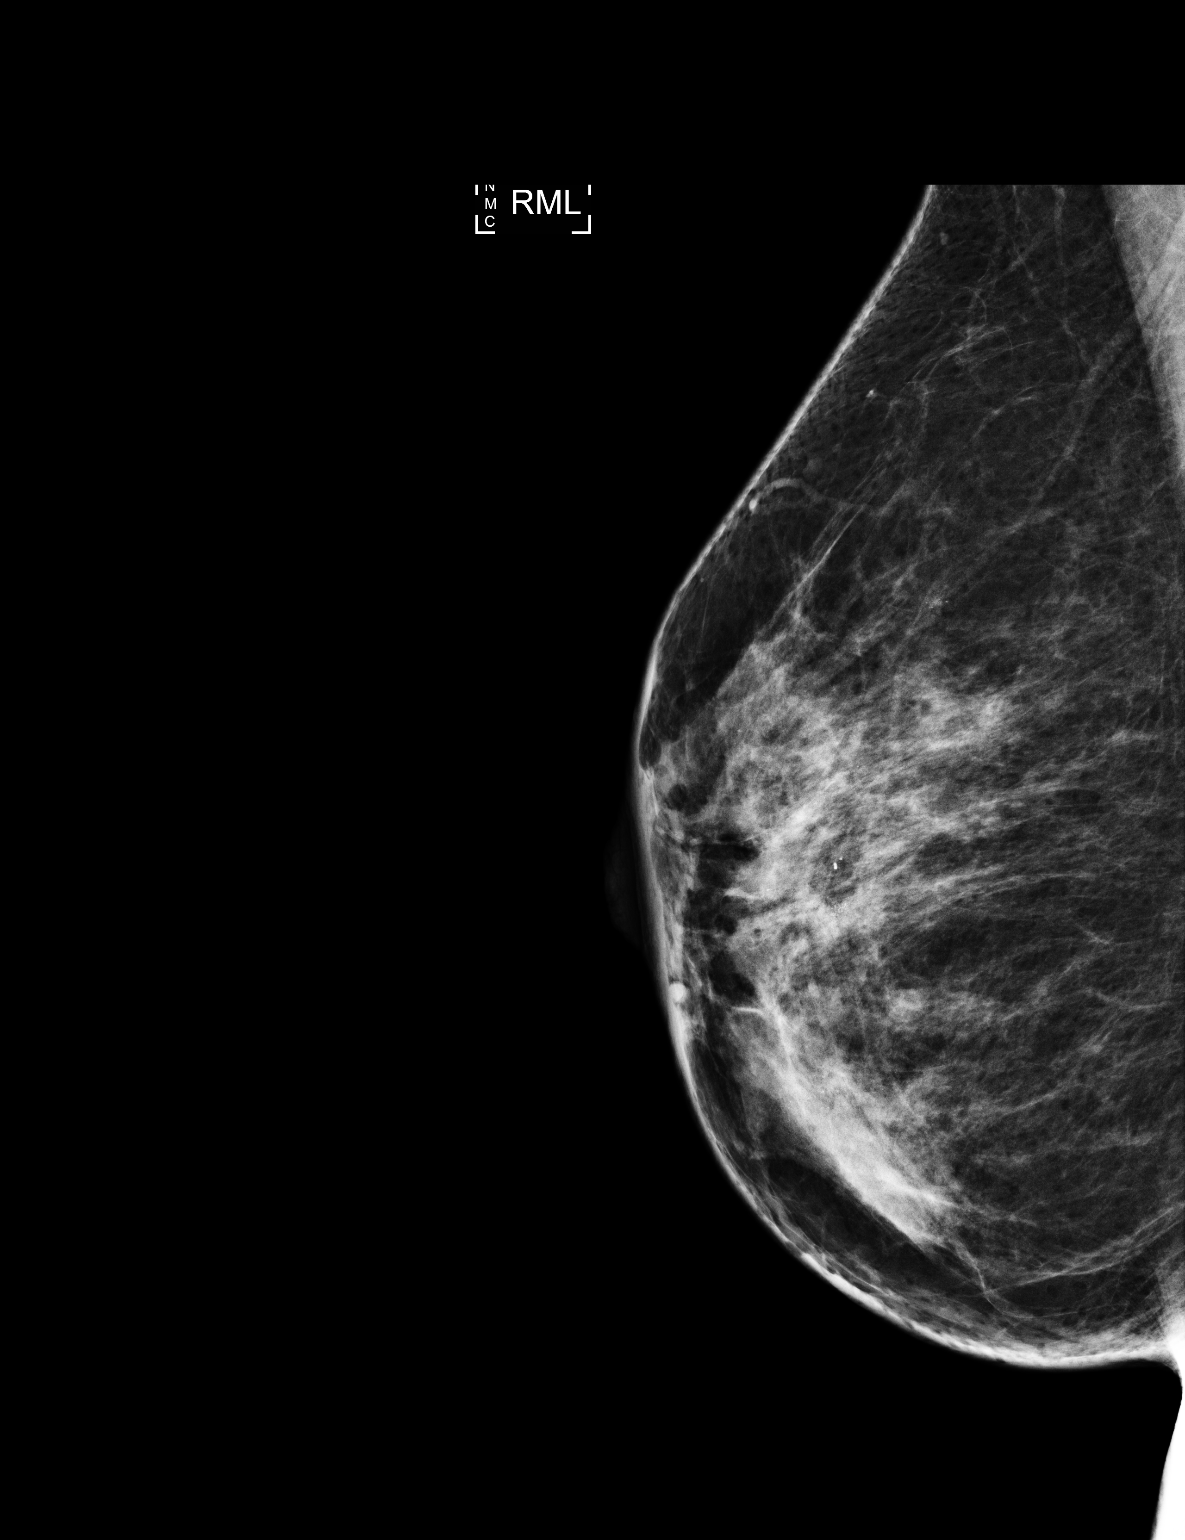

[R CC]
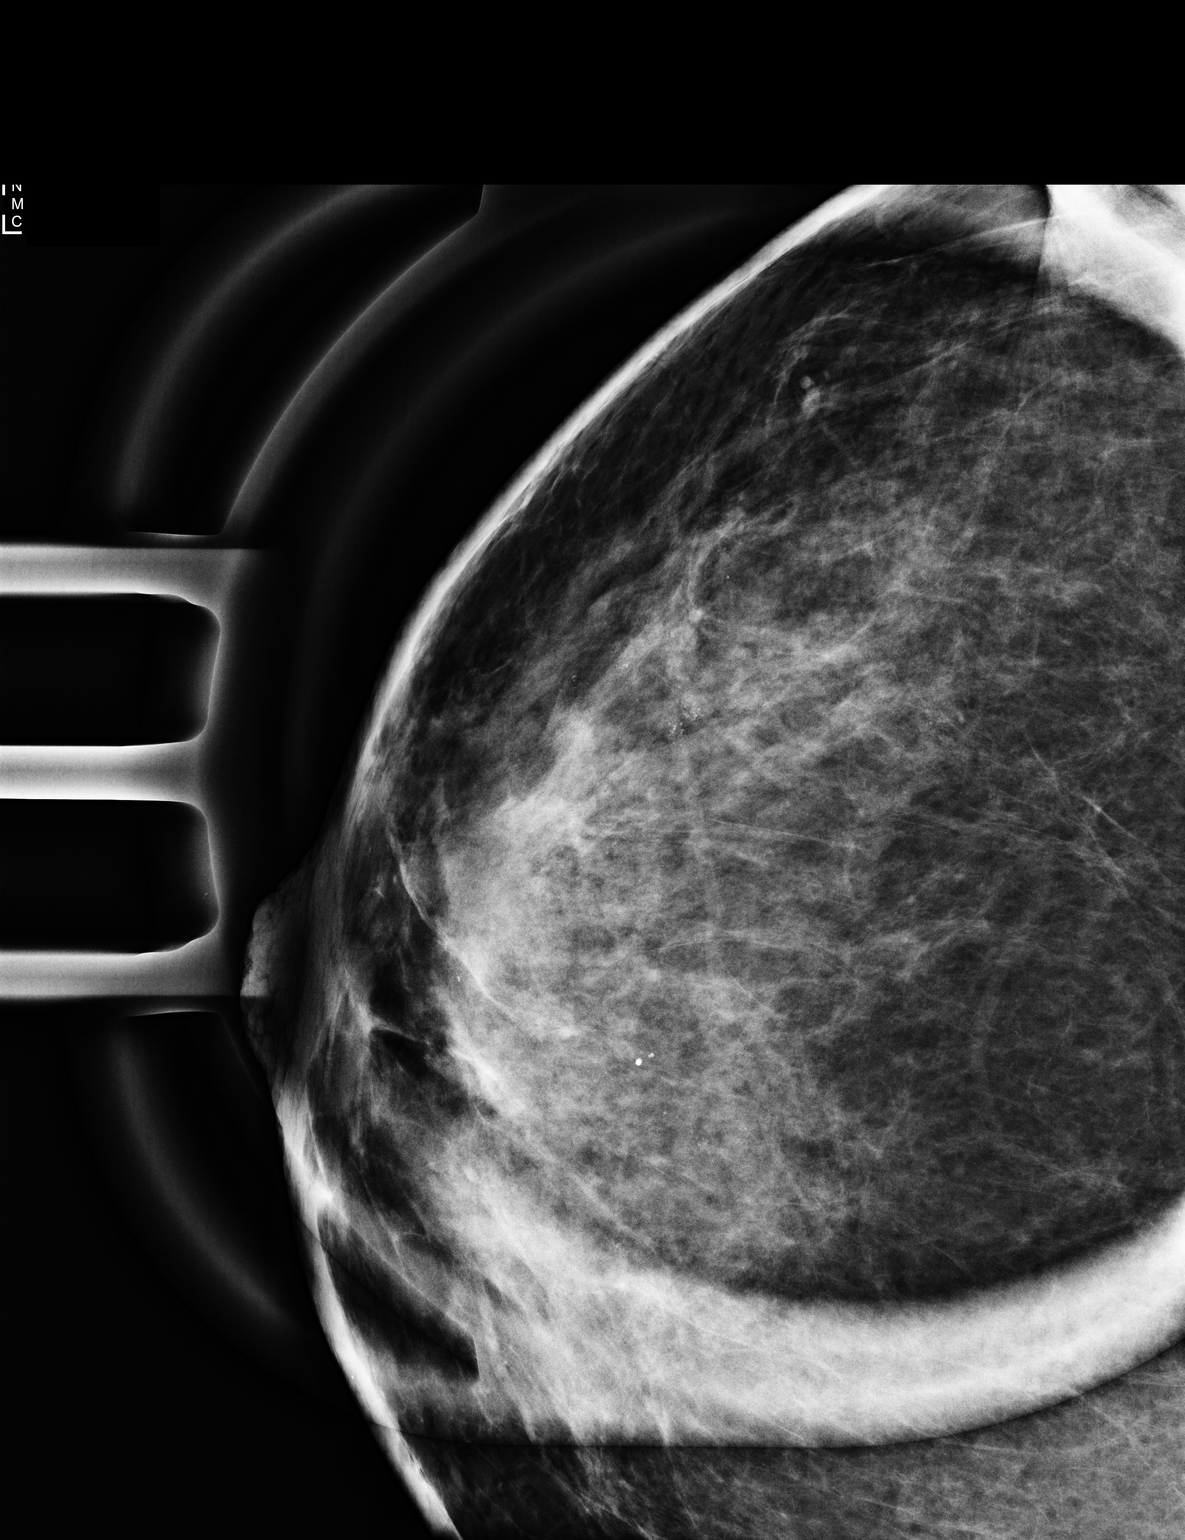

[R ML (2 of 2)]
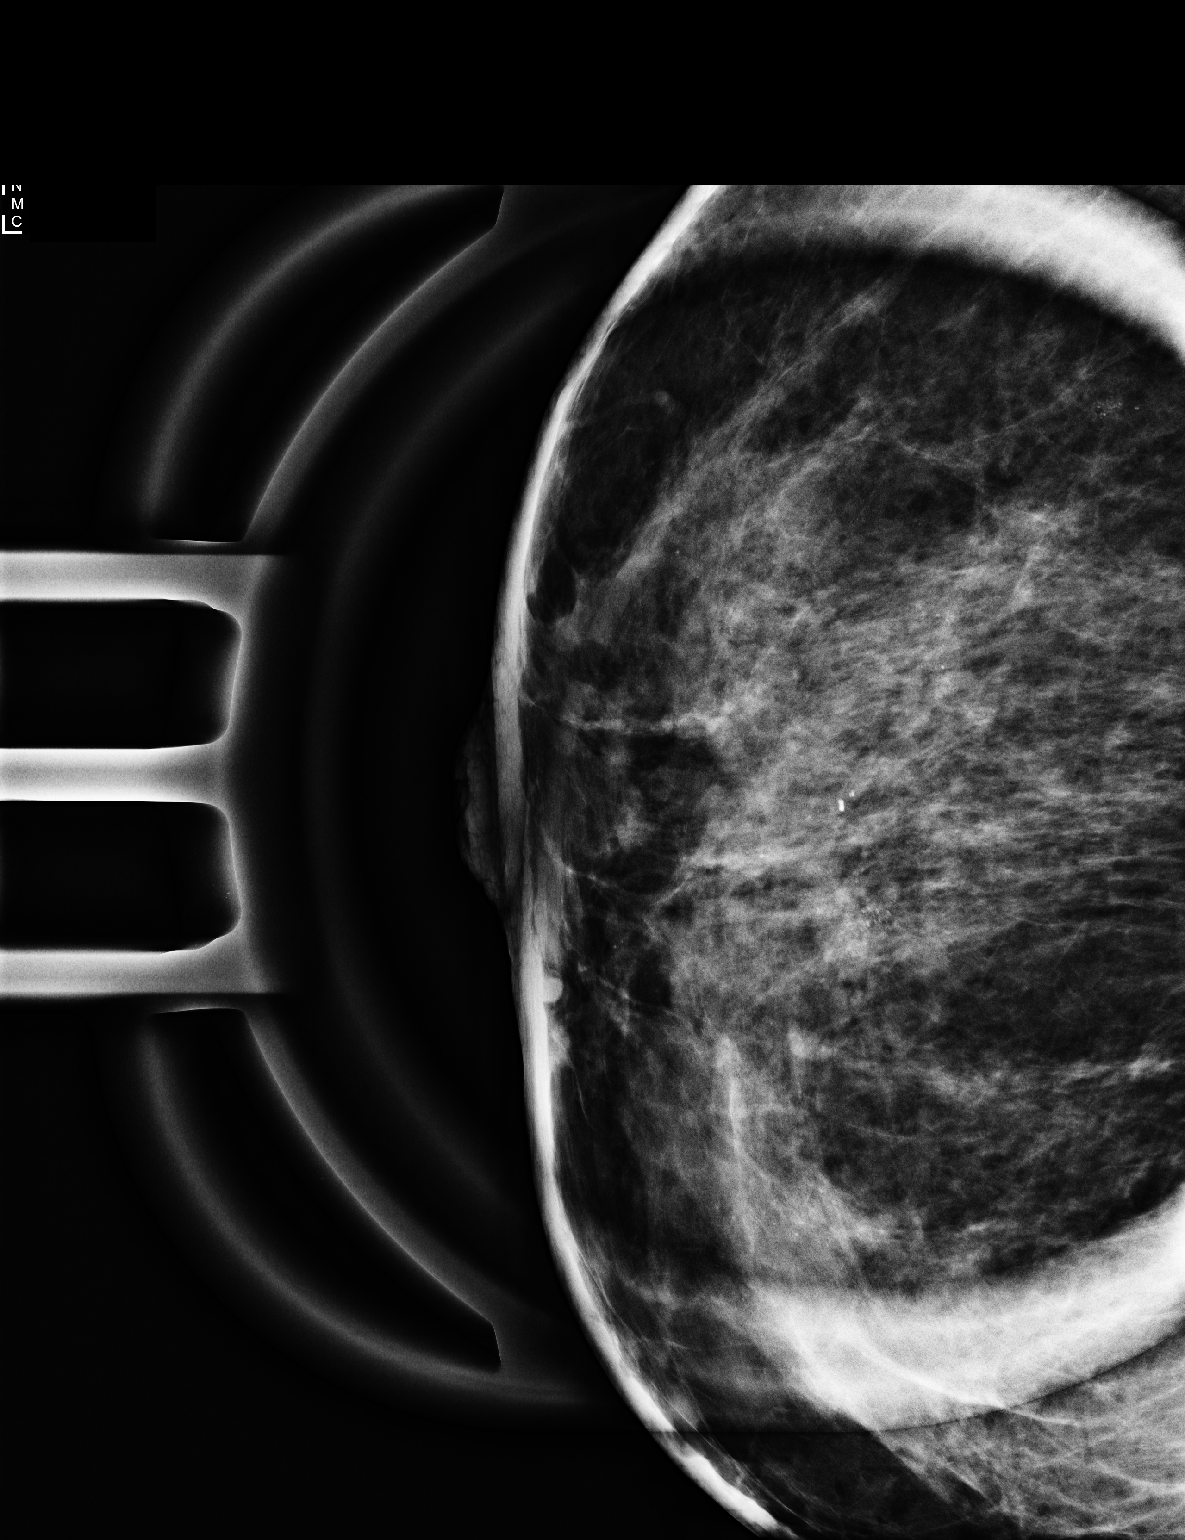

[3 of 3 positions shown; findings below may reference images not displayed]

ACR Breast Density Category c: The breast tissue is heterogeneously
dense, which may obscure small masses.
FINDINGS: Spot compression magnification views were performed over the central
to upper outer right breast demonstrating mixed punctate and
amorphous calcifications, several of which are scattered and loosely
grouped, however a more discrete group of these calcifications in
the lateral retroareolar right breast measures 1 cm and an
additional small more discrete group of these calcifications located
posterosuperior measures 0.4 cm.
IMPRESSION: Indeterminate calcifications in the central to upper outer right
breast.

RECOMMENDATION:
1. Recommend stereotactic guided biopsy of the 2 more discrete
groups of the calcifications in the central to upper outer right
breast (the 1 cm group of calcifications in the lateral retroareolar
right breast and the additional 0.4 cm group of calcifications
located posterosuperior).

2. The patient states she has had prior mammograms in [REDACTED] however
these were several years prior. if these mammograms are made
available prior to her scheduled biopsy for comparison, then in the
addendum will be made to this report.

I have discussed the findings and recommendations with the patient.
If applicable, a reminder letter will be sent to the patient
regarding the next appointment.

BI-RADS CATEGORY  4: Suspicious.

## 2023-05-08 IMAGING — MG MM BREAST BX W/ LOC DEV EA AD LESION IMAG BX SPEC STEREO GUIDE*R
6 of 15 series · 6 of 35 positions shown · non-contrast
Comparison: Previous exams
COMPARISON: Previous exams

Addendum:
CLINICAL DATA: 50-year-old with 2 groups of screening detected
indeterminate calcifications involving the RIGHT breast, a 1.0 cm
group of calcifications in the outer breast at middle depth and a
0.4 cm group of calcifications in the UPPER OUTER QUADRANT at
posterior depth.

EXAM:
RIGHT BREAST STEREOTACTIC TOMOSYNTHESIS CORE NEEDLE BIOPSY x 2
DIAGNOSTIC RIGHT MAMMOGRAM POST STEREOTACTIC NEEDLE BIOPSY

[R (1 of 6)]
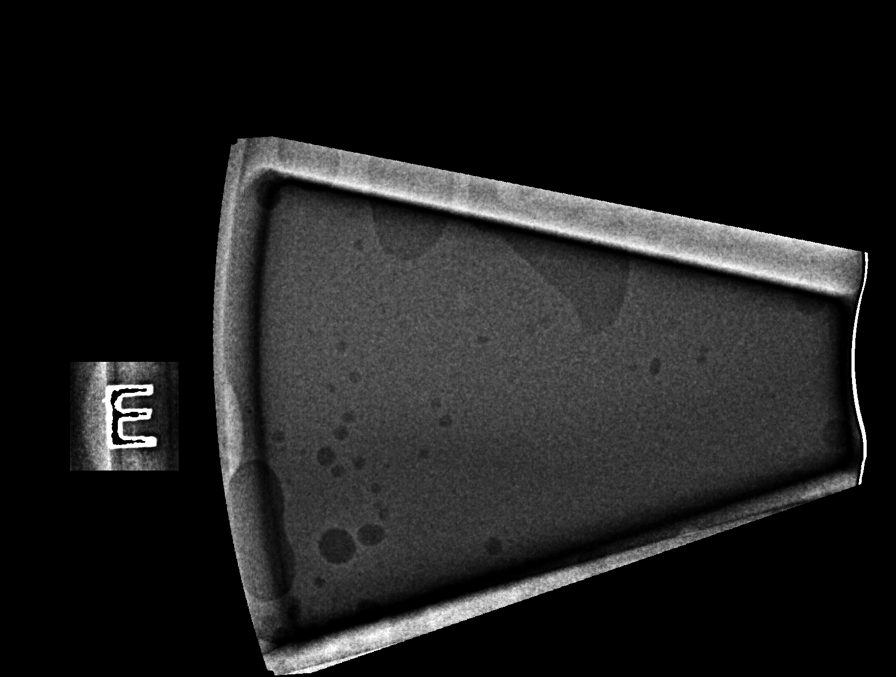

[R (2 of 6)]
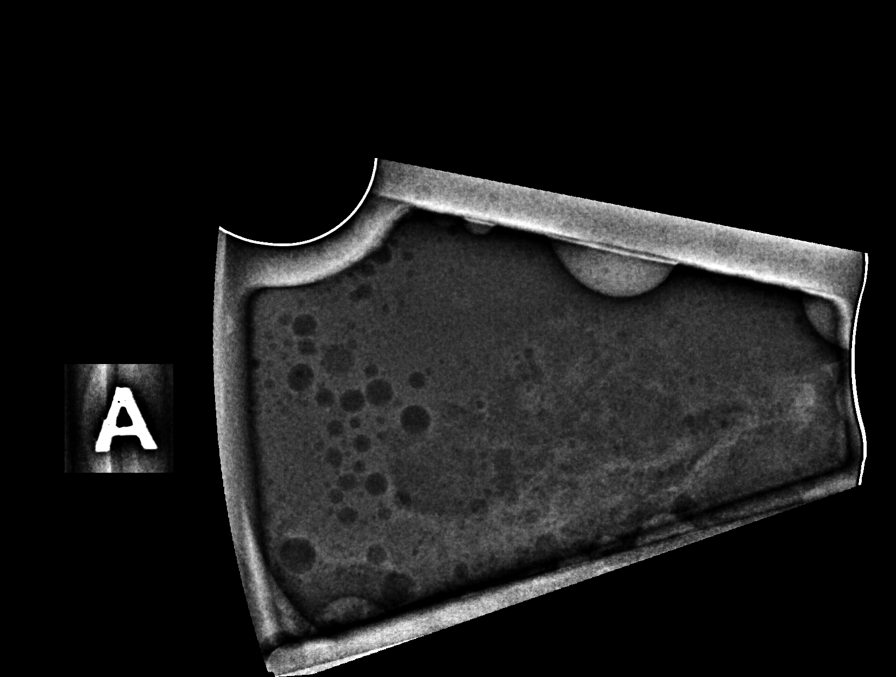

[R (3 of 6)]
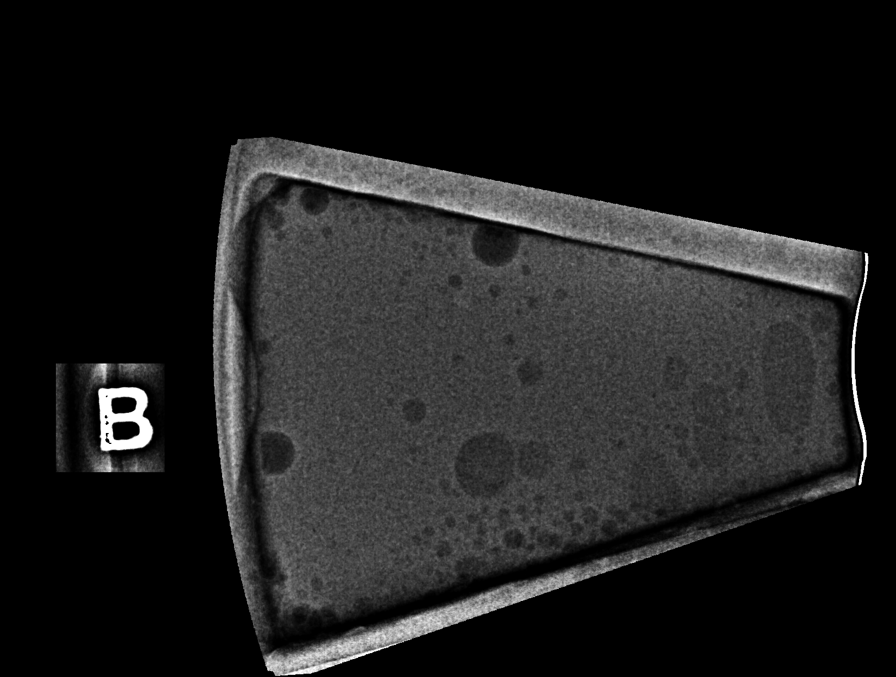

[R (4 of 6)]
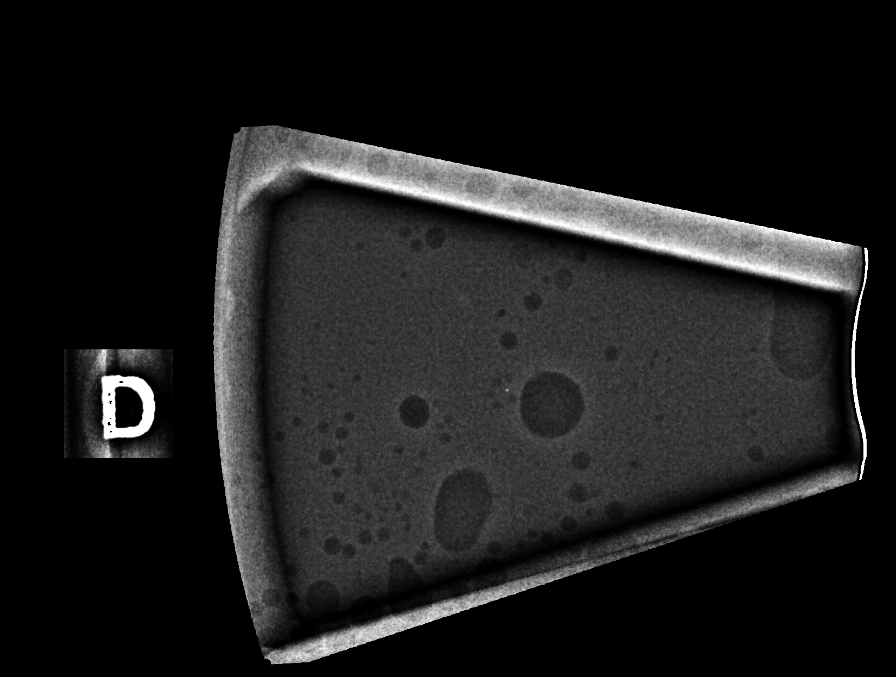

[R (5 of 6)]
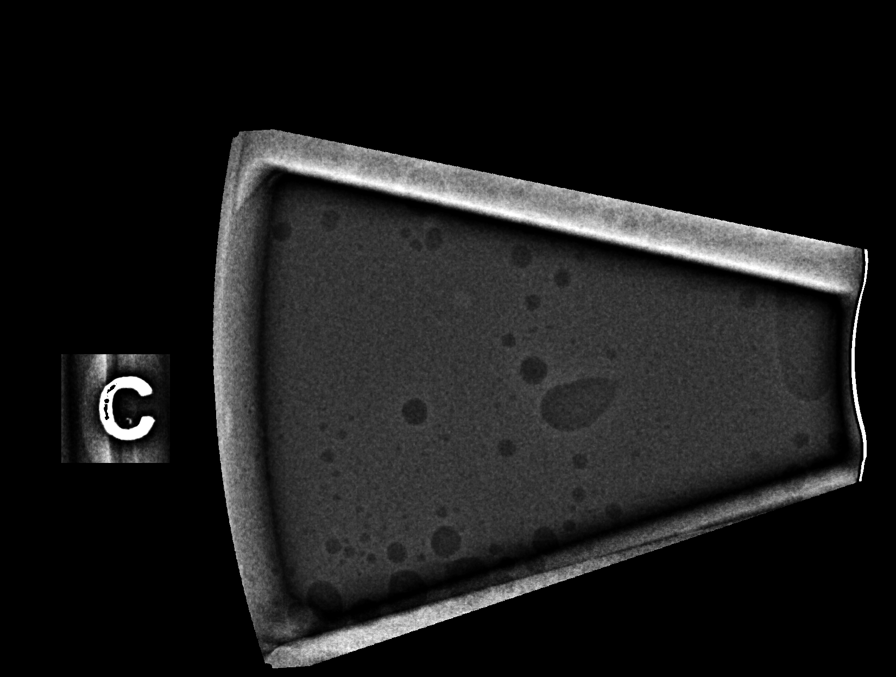

[R (6 of 6)]
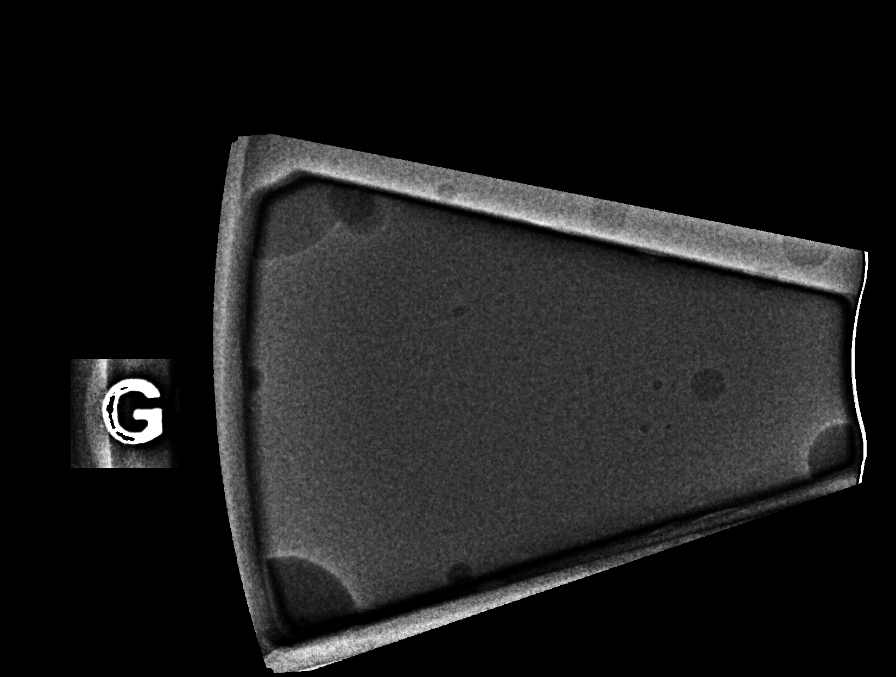

[6 of 35 positions shown; findings below may reference images not displayed]



# 1) Lesion quadrant: Outer breast at middle depth,

Using sterile technique with chlorhexidine as skin antisepsis, 1%
lidocaine and 1% lidocaine with epinephrine as local anesthetic,
under stereotactic tomosynthesis guidance, a 9 gauge Brevera vacuum
assisted device was used to perform core needle biopsy of
calcifications in the outer breast at middle depth using a superior
approach. Specimen radiograph was performed showing calcifications
in all 3 core samples. Specimens with calcifications are identified
for pathology. At the conclusion of the procedure, a coil shaped
tissue marker clip was deployed into the biopsy cavity.

# 2) Lesion quadrant: UPPER OUTER QUADRANT at posterior depth.

Using sterile technique with chlorhexidine as skin antisepsis, 1%
lidocaine and 1% lidocaine with epinephrine as local anesthetic
under stereotactic tomosynthesis guidance, a 9 gauge Brevera vacuum
assisted device was used perform core needle biopsy of
calcifications in the UPPER OUTER QUADRANT at middle to posterior
depth using a superior approach. The biopsy needle malfunctioned
after the initial core sample, though there appears to be faint
calcifications within this single sample. At the conclusion of the
procedure, a ribbon shaped tissue marker clip was deployed into the
biopsy cavity.

Follow-up 2D and 3D full field CC and mediolateral mammographic
images were obtained to confirm clip placement. The coil shaped
tissue marking clip is appropriately positioned at the site the
biopsied calcifications in the outer breast middle depth. The ribbon
shaped tissue marking clip is appropriately positioned at the site
of the biopsied calcifications in the UPPER OUTER QUADRANT at
posterior depth. The clips are separated by approximately 5 cm.
Expected post biopsy changes are present at both sites without
evidence of hematoma.
IMPRESSION: 1. Stereotactic tomosynthesis guided core needle biopsy of
calcifications involving the outer RIGHT breast at middle depth.
2. Stereotactic tomosynthesis guided core needle biopsy of
calcifications involving the UPPER OUTER QUADRANT of the RIGHT
breast at posterior depth.
3. Appropriate positioning of the coil shaped tissue marking clip at
the site of the biopsied calcifications in the outer breast at
middle depth, localizing to the LOWER OUTER QUADRANT.
4. Appropriate positioning of the ribbon shaped tissue marking clip
at the site of the biopsied calcifications in the UPPER OUTER
QUADRANT at posterior depth.
5. The biopsy clips are separated by approximately 5 cm.

ADDENDUM:
Pathology revealed BENIGN BREAST TISSUE WITH FIBROCYSTIC CHANGE,
COLUMNAR CELL CHANGE AND APOCRINE METAPLASIA WITH ASSOCIATED
MICROCALCIFICATIONS of the RIGHT breast, outer at middle depth,
(coil clip). This was found to be concordant by Dr. Concon Barlaan.

Pathology revealed BENIGN BREAST TISSUE WITH FIBROCYSTIC CHANGE,
COLUMNAR CELL CHANGE AND ASSOCIATED MICROCALCIFICATIONS of the RIGHT
breast, upper outer at posterior depth, (ribbon clip). This was
found to be concordant by Dr. Concon Barlaan.

Pathology results were discussed with the patient by telephone. The
patient reported doing well after the biopsies with tenderness at
the sites. Post biopsy instructions and care were reviewed and
questions were answered. The patient was encouraged to call The
direct phone number was provided.

The patient was instructed to return for annual screening
mammography in February 2023.

Pathology results reported by Lkw Tiger, RN on 03/24/2022.



# 1) Lesion quadrant: Outer breast at middle depth,

Using sterile technique with chlorhexidine as skin antisepsis, 1%
lidocaine and 1% lidocaine with epinephrine as local anesthetic,
under stereotactic tomosynthesis guidance, a 9 gauge Brevera vacuum
assisted device was used to perform core needle biopsy of
calcifications in the outer breast at middle depth using a superior
approach. Specimen radiograph was performed showing calcifications
in all 3 core samples. Specimens with calcifications are identified
for pathology. At the conclusion of the procedure, a coil shaped
tissue marker clip was deployed into the biopsy cavity.

# 2) Lesion quadrant: UPPER OUTER QUADRANT at posterior depth.

Using sterile technique with chlorhexidine as skin antisepsis, 1%
lidocaine and 1% lidocaine with epinephrine as local anesthetic
under stereotactic tomosynthesis guidance, a 9 gauge Brevera vacuum
assisted device was used perform core needle biopsy of
calcifications in the UPPER OUTER QUADRANT at middle to posterior
depth using a superior approach. The biopsy needle malfunctioned
after the initial core sample, though there appears to be faint
calcifications within this single sample. At the conclusion of the
procedure, a ribbon shaped tissue marker clip was deployed into the
biopsy cavity.

Follow-up 2D and 3D full field CC and mediolateral mammographic
images were obtained to confirm clip placement. The coil shaped
tissue marking clip is appropriately positioned at the site the
biopsied calcifications in the outer breast middle depth. The ribbon
shaped tissue marking clip is appropriately positioned at the site
of the biopsied calcifications in the UPPER OUTER QUADRANT at
posterior depth. The clips are separated by approximately 5 cm.
Expected post biopsy changes are present at both sites without
evidence of hematoma.
IMPRESSION: 1. Stereotactic tomosynthesis guided core needle biopsy of
calcifications involving the outer RIGHT breast at middle depth.
2. Stereotactic tomosynthesis guided core needle biopsy of
calcifications involving the UPPER OUTER QUADRANT of the RIGHT
breast at posterior depth.
3. Appropriate positioning of the coil shaped tissue marking clip at
the site of the biopsied calcifications in the outer breast at
middle depth, localizing to the LOWER OUTER QUADRANT.
4. Appropriate positioning of the ribbon shaped tissue marking clip
at the site of the biopsied calcifications in the UPPER OUTER
QUADRANT at posterior depth.
5. The biopsy clips are separated by approximately 5 cm.

## 2024-03-25 ENCOUNTER — Encounter: Payer: Self-pay | Admitting: *Deleted
